# Patient Record
Sex: Male | Born: 1995 | Hispanic: Yes | Marital: Single | Smoking: Never smoker
Health system: Southern US, Community
[De-identification: ages and names within clinical notes are randomized; demographics above are authoritative.]

---
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
## 2016-03-21 IMAGING — DX DG WRIST COMPLETE 3+V*R*
4 series · 4 of 4 positions shown · non-contrast
Comparison: None.

CLINICAL DATA: Right wrist pain, worsening for 2-3 weeks. No known
injury.

EXAM:
RIGHT WRIST - COMPLETE 3+ VIEW

[wrist pa (1 of 2)]
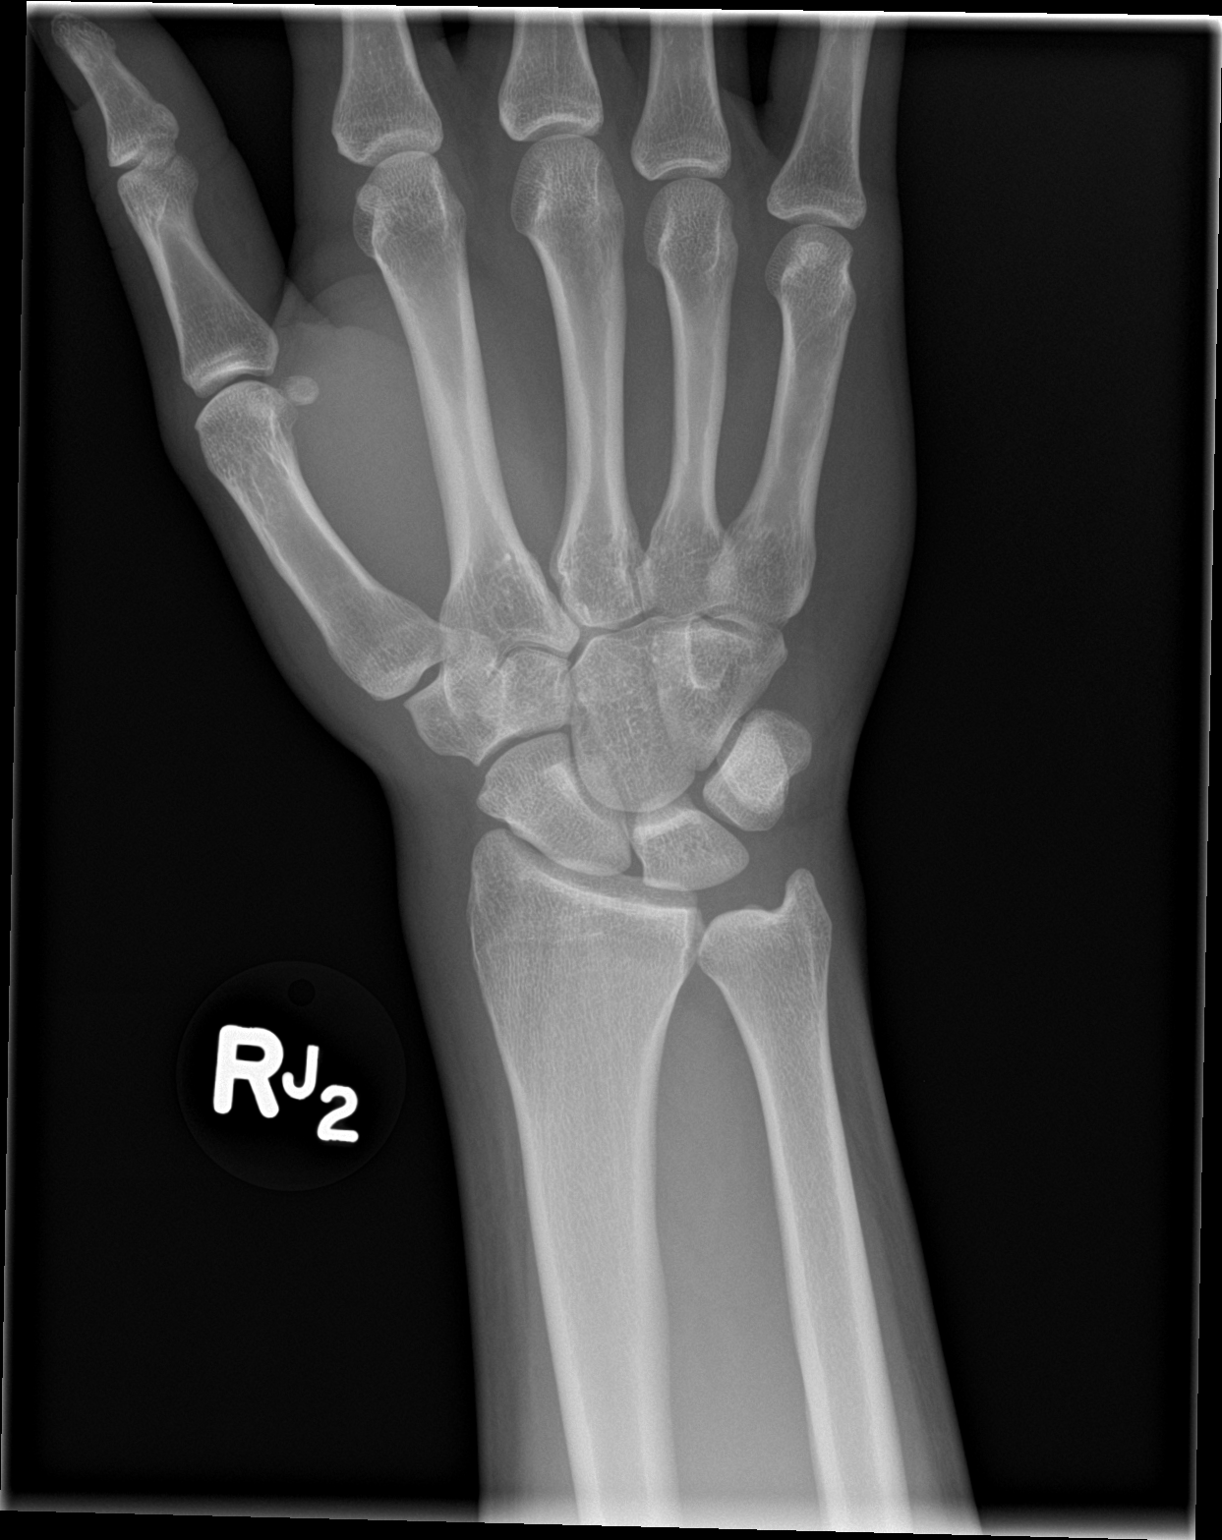

[wrist obl]
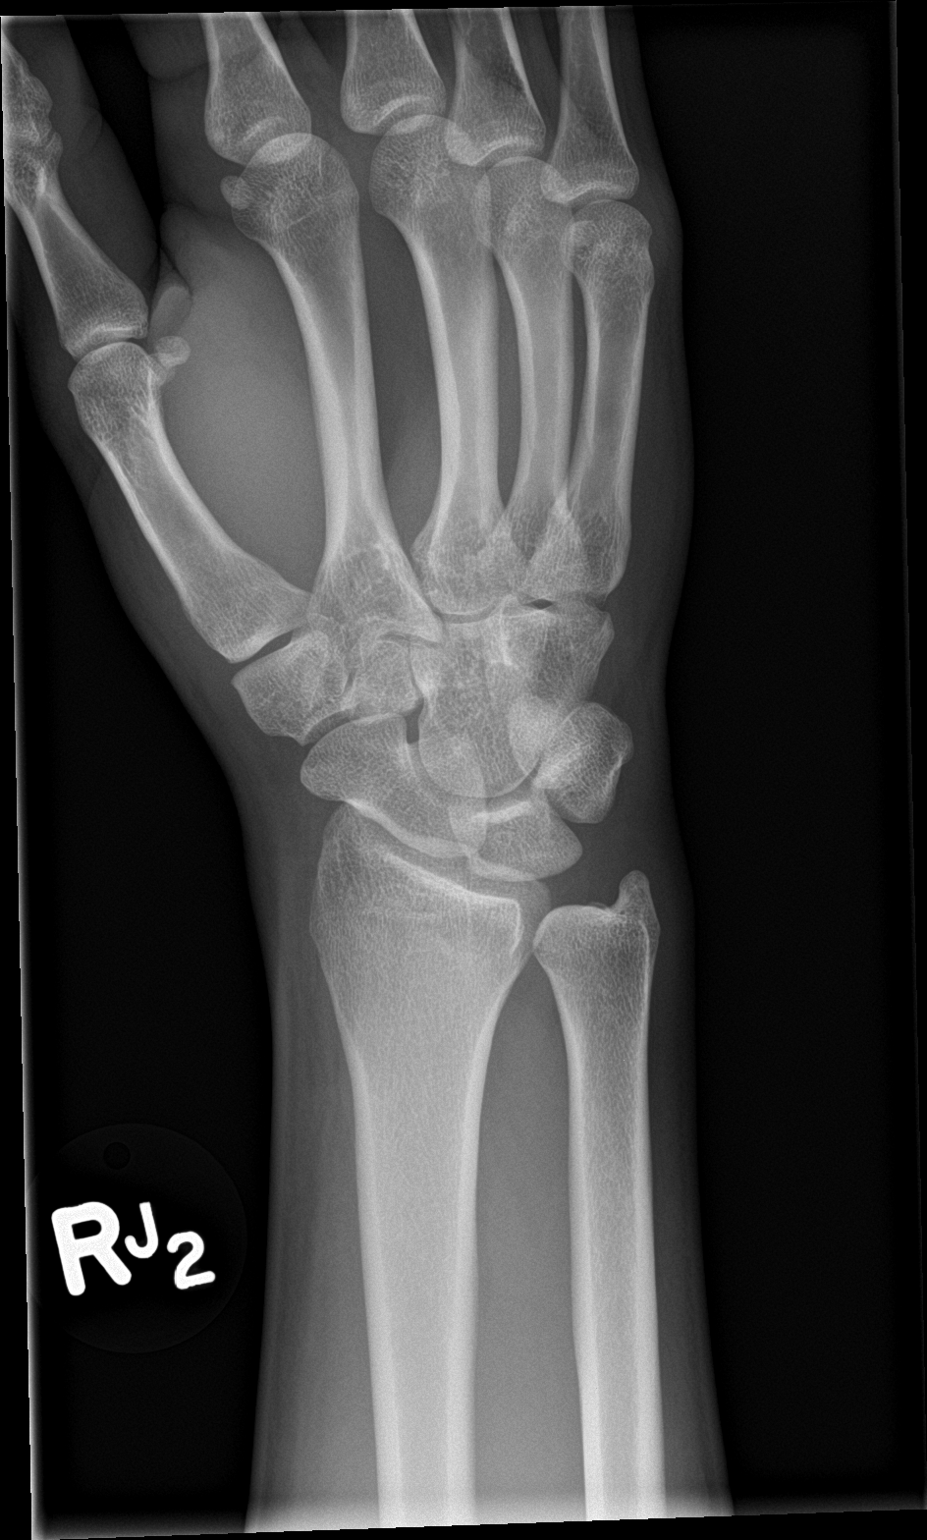

[wrist lat]
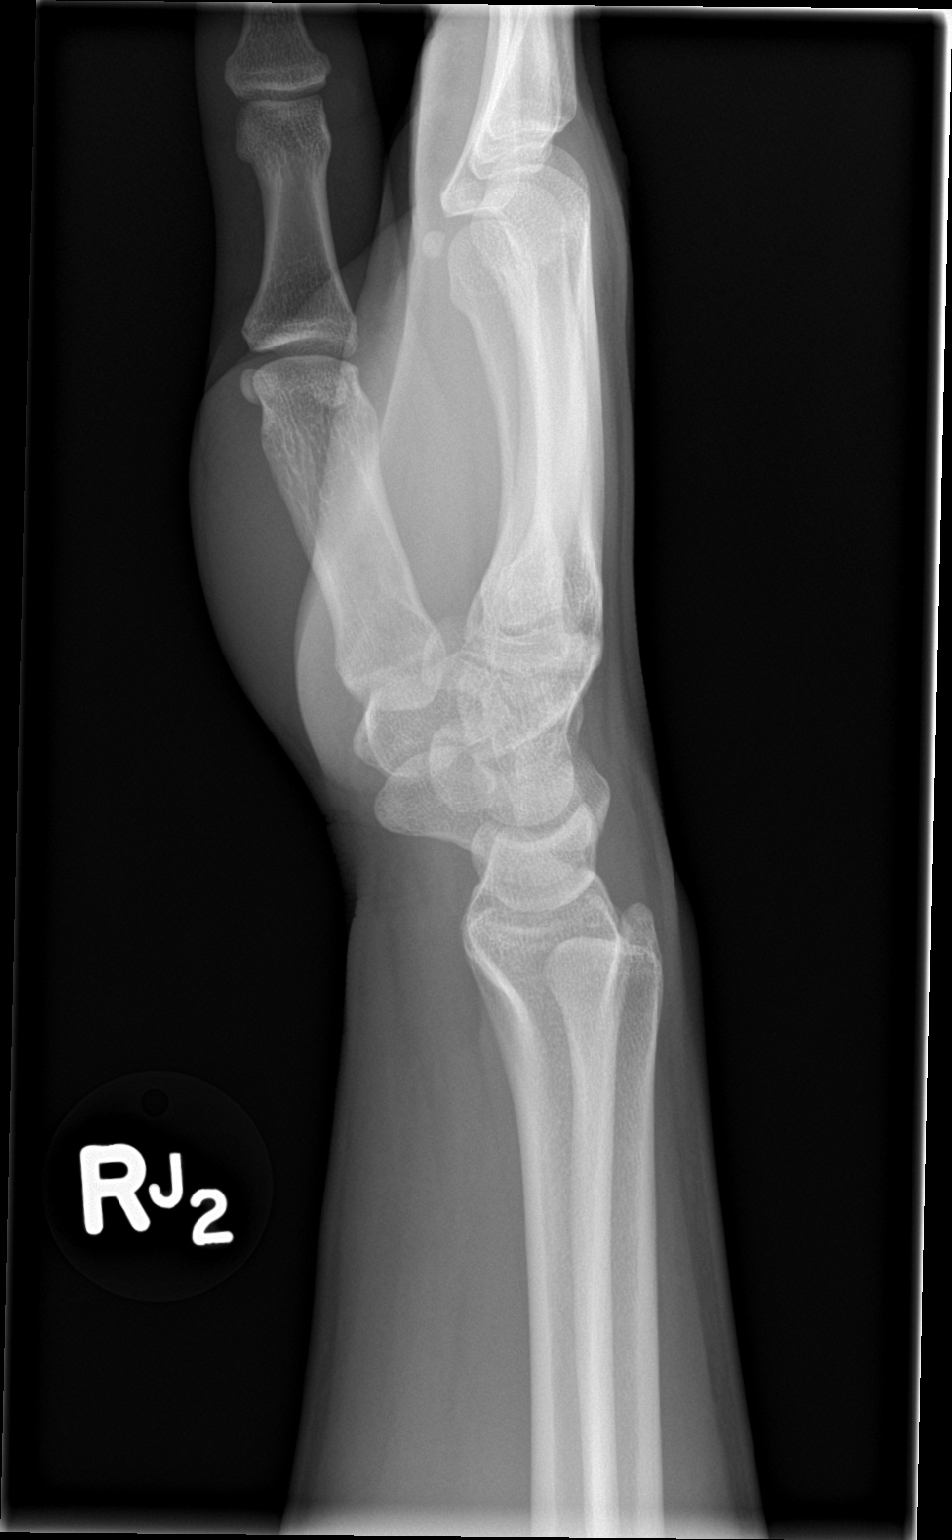

[wrist pa (2 of 2)]
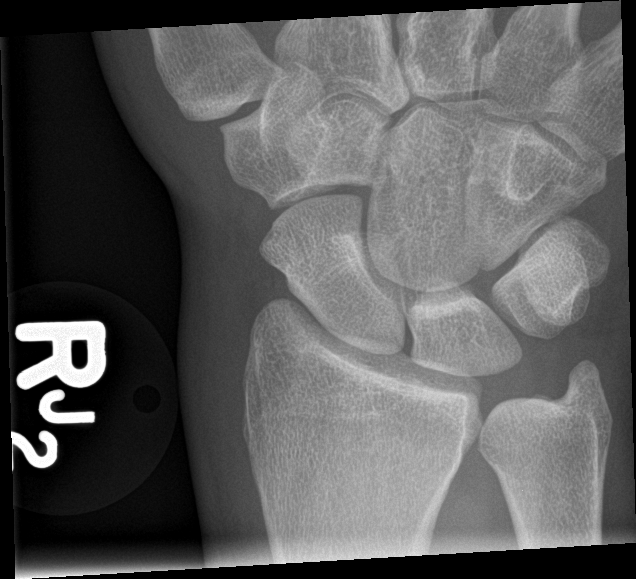

[4 of 4 positions shown; findings below may reference images not displayed]

FINDINGS: There is no evidence of fracture or dislocation. There is no
evidence of arthropathy or other focal bone abnormality. Soft
tissues are unremarkable.
IMPRESSION: Negative.

## 2019-01-13 ENCOUNTER — Other Ambulatory Visit: Payer: Self-pay | Admitting: *Deleted

## 2019-01-13 DIAGNOSIS — Z20822 Contact with and (suspected) exposure to covid-19: Secondary | ICD-10-CM

## 2019-01-14 LAB — NOVEL CORONAVIRUS, NAA: SARS-CoV-2, NAA: DETECTED — AB

## 2020-03-16 ENCOUNTER — Ambulatory Visit: Payer: Self-pay

## 2020-03-17 ENCOUNTER — Ambulatory Visit
Admission: RE | Admit: 2020-03-17 | Discharge: 2020-03-17 | Disposition: A | Discharge status: Home / Self Care | Payer: Self-pay | Source: Ambulatory Visit | Attending: Emergency Medicine | Admitting: Emergency Medicine

## 2020-03-17 ENCOUNTER — Other Ambulatory Visit: Payer: Self-pay

## 2020-03-17 VITALS — BP 132/83 | HR 74 | Temp 99.1°F | Resp 16

## 2020-03-17 DIAGNOSIS — L02413 Cutaneous abscess of right upper limb: Secondary | ICD-10-CM

## 2020-03-17 MED ORDER — DOXYCYCLINE HYCLATE 100 MG PO CAPS: 100.0000 mg | ORAL_CAPSULE | Freq: Two times a day (BID) | ORAL | 0 refills | Status: DC

## 2020-03-17 NOTE — ED Triage Notes (Signed)
Patient has an abcess on his right elbow, pain and tendness to the area.

## 2020-03-17 NOTE — Discharge Instructions (Addendum)
Apply warm compresses 3-4x daily for 10-15 minutes Wash site daily with warm water and mild soap Keep covered to avoid friction Take antibiotic as prescribed and to completion Follow up here or with PCP if symptoms persists Return or go to the ED if you have any new or worsening symptoms increased redness, swelling, pain, nausea, vomiting, fever, chills, etc...  

## 2020-03-17 NOTE — ED Provider Notes (Signed)
  [facility]   [id number] 03/17/20 Arrival Time: 1729   SW:NIOEVOJ  SUBJECTIVE:  Gregory Richardson is a 25 y.o. male who presents to the urgent care with a complaint of abscess to right elbow for the past few days.  Denies any precipitating event.  Localized pain to the right elbow.  Has tried OTC medication without relief.  Denies similar symptoms in the past.  Denies chills, fever, nausea, vomiting, diarrhea.   ROS: As per HPI.  All other pertinent ROS negative.      History reviewed. No pertinent past medical history. History reviewed. No pertinent surgical history. No Known Allergies No current facility-administered medications on file prior to encounter.   Current Outpatient Medications on File Prior to Encounter  Medication Sig Dispense Refill  . ibuprofen (ADVIL,MOTRIN) 600 MG tablet Take 1 tablet (600 mg total) by mouth every 6 (six) hours as needed. 30 tablet 0   Social History   Socioeconomic History  . Marital status: Single    Spouse name: Not on file  . Number of children: Not on file  . Years of education: Not on file  . Highest education level: Not on file  Occupational History  . Not on file  Tobacco Use  . Smoking status: Never Smoker  . Smokeless tobacco: Never Used  Substance and Sexual Activity  . Alcohol use: No  . Drug use: No  . Sexual activity: Not on file  Other Topics Concern  . Not on file  Social History Narrative  . Not on file   Social Determinants of Health   Financial Resource Strain: Not on file  Food Insecurity: Not on file  Transportation Needs: Not on file  Physical Activity: Not on file  Stress: Not on file  Social Connections: Not on file  Intimate Partner Violence: Not on file   History reviewed. No pertinent family history.  OBJECTIVE:  Vitals:   03/17/20 1807  BP: 132/83  Pulse: 74  Resp: 16  Temp: 99.1 F (37.3 C)  SpO2: 98%     General appearance: alert; no distress Chest: CTA, heart sounds  normal Heart: RRR, no rub, gallop or murmur Skin:2 cm induration of his right elbow; tender to touch; with active drainage Psychological: alert and cooperative; normal mood and affect   ASSESSMENT & PLAN:  1. Abscess of right elbow     Meds ordered this encounter  Medications  . doxycycline (VIBRAMYCIN) 100 MG capsule    Sig: Take 1 capsule (100 mg total) by mouth 2 (two) times daily.    Dispense:  20 capsule    Refill:  0   Discharge instructions   Apply warm compresses 3-4x daily for 10-15 minutes Wash site daily with warm water and mild soap Keep covered to avoid friction Take antibiotic as prescribed and to completion Follow up here or with PCP if symptoms persists Return or go to the ED if you have any new or worsening symptoms increased redness, swelling, pain, nausea, vomiting, fever, chills, etc...    Reviewed expectations re: course of current medical issues. Questions answered. Outlined signs and symptoms indicating need for more acute intervention. Patient verbalized understanding. After Visit Summary given.           Durward Parcel, FNP 03/17/20 1840

## 2020-03-18 ENCOUNTER — Ambulatory Visit (HOSPITAL_COMMUNITY): Payer: Self-pay

## 2020-06-22 ENCOUNTER — Other Ambulatory Visit: Payer: Self-pay

## 2020-06-22 ENCOUNTER — Ambulatory Visit
Admission: RE | Admit: 2020-06-22 | Discharge: 2020-06-22 | Disposition: A | Discharge status: Home / Self Care | Payer: Self-pay | Source: Ambulatory Visit | Attending: Family Medicine | Admitting: Family Medicine

## 2020-06-22 VITALS — BP 125/75 | HR 66 | Temp 98.8°F | Resp 14

## 2020-06-22 DIAGNOSIS — B349 Viral infection, unspecified: Secondary | ICD-10-CM

## 2020-06-22 DIAGNOSIS — Z20822 Contact with and (suspected) exposure to covid-19: Secondary | ICD-10-CM

## 2020-06-22 MED ORDER — IPRATROPIUM BROMIDE 0.03 % NA SOLN: 2.0000 | Freq: Two times a day (BID) | NASAL | 0 refills | Status: DC

## 2020-06-22 MED ORDER — LEVOCETIRIZINE DIHYDROCHLORIDE 5 MG PO TABS: 5.0000 mg | ORAL_TABLET | Freq: Every evening | ORAL | 0 refills | Status: DC

## 2020-06-22 NOTE — ED Triage Notes (Signed)
Sore throat and body aches with fever since last night.  Productive cough with clear/yellow sputum.

## 2020-06-22 NOTE — ED Provider Notes (Signed)
[facility]    CSN: [id number] Arrival date & time: 06/22/20  9371      History   Chief Complaint No chief complaint on file.   HPI Gregory Richardson is a 25 y.o. male.   HPI  Patient presents with URI symptoms including cough, sore throat, body aches, fever >100 x 1, nasal congestion x 1 day . Concern for possible strep, recent exposure. No history of recurrent strep. Mild nausea without vomiting. Taken tylenol for pain. Denies worrisome symptoms of shortness of breath, weakness.  History reviewed. No pertinent past medical history.  There are no problems to display for this patient.   History reviewed. No pertinent surgical history.     Home Medications    Prior to Admission medications   Medication Sig Start Date End Date Taking? Authorizing Provider  doxycycline (VIBRAMYCIN) 100 MG capsule Take 1 capsule (100 mg total) by mouth 2 (two) times daily. 03/17/20   Avegno, Zachery Dakins, FNP  ibuprofen (ADVIL,MOTRIN) 600 MG tablet Take 1 tablet (600 mg total) by mouth every 6 (six) hours as needed. 12/15/14   Ivery Quale, PA-C    Family History Family History  Problem Relation Age of Onset  . Diabetes Mother   . Cancer Father     Social History Social History   Tobacco Use  . Smoking status: Never Smoker  . Smokeless tobacco: Never Used  Substance Use Topics  . Alcohol use: No  . Drug use: No     Allergies   Patient has no known allergies.   Review of Systems Review of Systems Pertinent negatives listed in HPI  Physical Exam Triage Vital Signs ED Triage Vitals  Enc Vitals Group     BP 06/22/20 0947 125/75     Pulse Rate 06/22/20 0947 66     Resp 06/22/20 0947 14     Temp 06/22/20 0947 98.8 F (37.1 C)     Temp Source 06/22/20 0947 Oral     SpO2 06/22/20 0947 97 %     Weight --      Height --      Head Circumference --      Peak Flow --      Pain Score 06/22/20 0950 4     Pain Loc --      Pain Edu? --      Excl. in GC? --    No  data found.  Updated Vital Signs BP 125/75 (BP Location: Right Arm)   Pulse 66   Temp 98.8 F (37.1 C) (Oral)   Resp 14   SpO2 97%   Visual Acuity Right Eye Distance:   Left Eye Distance:   Bilateral Distance:    Right Eye Near:   Left Eye Near:    Bilateral Near:     Physical Exam  General Appearance:    Alert, cooperative, no distress  HENT:   Normocephalic, ears normal, nares mucosal edema with congestion, rhinorrhea, oropharynx without exudate or swelling   Eyes:    PERRL, conjunctiva/corneas clear, EOM's intact       Lungs:     Clear to auscultation bilaterally, respirations unlabored  Heart:    Regular rate and rhythm  Neurologic:   Awake, alert, oriented x 3. No apparent focal neurological           defect.      UC Treatments / Results  Labs (all labs ordered are listed, but only abnormal results are displayed) Labs Reviewed  COVID-19, FLU A+B  NAA    EKG   Radiology No results found.  Procedures Procedures (including critical care time)  Medications Ordered in UC Medications - No data to display  Initial Impression / Assessment and Plan / UC Course  I have reviewed the triage vital signs and the nursing notes.  Pertinent labs & imaging results that were available during my care of the patient were reviewed by me and considered in my medical decision making (see chart for details).    Viral illness, COVID/Flu test pending, treating with Atrovent and Levocetirizine for management of symptoms. Continue tylenol and ibuprofen for management of fever Final Clinical Impressions(s) / UC Diagnoses   Final diagnoses:  Exposure to COVID-19 virus  Viral illness   Discharge Instructions   None    ED Prescriptions    Medication Sig Dispense Auth. Provider   ipratropium (ATROVENT) 0.03 % nasal spray Place 2 sprays into both nostrils 2 (two) times daily. 30 mL Bing Neighbors, FNP   levocetirizine (XYZAL) 5 MG tablet Take 1 tablet (5 mg total) by mouth  every evening. 30 tablet Bing Neighbors, FNP     PDMP not reviewed this encounter.   Bing Neighbors, [place] 06/26/20 [phone]

## 2020-06-23 LAB — COVID-19, FLU A+B NAA
Influenza A, NAA: NOT DETECTED
Influenza B, NAA: NOT DETECTED
SARS-CoV-2, NAA: NOT DETECTED

## 2021-02-27 ENCOUNTER — Ambulatory Visit
Admission: EM | Admit: 2021-02-27 | Discharge: 2021-02-27 | Disposition: A | Discharge status: Home / Self Care | Payer: Self-pay | Attending: Urgent Care | Admitting: Urgent Care

## 2021-02-27 ENCOUNTER — Other Ambulatory Visit: Payer: Self-pay

## 2021-02-27 ENCOUNTER — Ambulatory Visit: Payer: Self-pay

## 2021-02-27 DIAGNOSIS — R070 Pain in throat: Secondary | ICD-10-CM | POA: Insufficient documentation

## 2021-02-27 DIAGNOSIS — J029 Acute pharyngitis, unspecified: Secondary | ICD-10-CM | POA: Insufficient documentation

## 2021-02-27 DIAGNOSIS — Z0189 Encounter for other specified special examinations: Secondary | ICD-10-CM | POA: Insufficient documentation

## 2021-02-27 DIAGNOSIS — Z20822 Contact with and (suspected) exposure to covid-19: Secondary | ICD-10-CM | POA: Insufficient documentation

## 2021-02-27 LAB — POCT RAPID STREP A (OFFICE): Rapid Strep A Screen: NEGATIVE

## 2021-02-27 MED ORDER — CHLORASEPTIC 1.4 % MT LIQD: 1.0000 | Freq: Three times a day (TID) | OROMUCOSAL | 0 refills | Status: DC | PRN

## 2021-02-27 MED ORDER — PSEUDOEPHEDRINE HCL 60 MG PO TABS: 60.0000 mg | ORAL_TABLET | Freq: Three times a day (TID) | ORAL | 0 refills | Status: DC | PRN

## 2021-02-27 MED ORDER — NAPROXEN 500 MG PO TABS: 500.0000 mg | ORAL_TABLET | Freq: Two times a day (BID) | ORAL | 0 refills | Status: DC

## 2021-02-27 MED ORDER — CETIRIZINE HCL 10 MG PO TABS: 10.0000 mg | ORAL_TABLET | Freq: Every day | ORAL | 0 refills | Status: DC

## 2021-02-27 NOTE — ED Provider Notes (Signed)
[facility]   MRN: [id number] DOB: 31-Mar-1995  Subjective:   Gregory Richardson is a 26 y.o. male presenting for 1 day history of acute onset throat pain, painful swallowing, swollen tonsil.  No fever, runny or stuffy nose, chest pain, shortness of breath or wheezing.  Wants to be tested for strep, would also like a COVID test as his daughter tested positive.  No current facility-administered medications for this encounter. No current outpatient medications on file.   No Known Allergies  History reviewed. No pertinent past medical history.   History reviewed. No pertinent surgical history.  Family History  Problem Relation Age of Onset   Diabetes Mother    Cancer Father     Social History   Tobacco Use   Smoking status: Never   Smokeless tobacco: Never  Substance Use Topics   Alcohol use: No   Drug use: Yes    Frequency: 1.0 times per week    ROS   Objective:   Vitals: BP 136/83 (BP Location: Right Arm)    Pulse 69    Temp 99.2 F (37.3 C) (Oral)    Resp 16    SpO2 98%   Physical Exam Constitutional:      General: He is not in acute distress.    Appearance: Normal appearance. He is well-developed. He is not ill-appearing, toxic-appearing or diaphoretic.  HENT:     Head: Normocephalic and atraumatic.     Right Ear: External ear normal.     Left Ear: External ear normal.     Nose: Nose normal.     Mouth/Throat:     Mouth: Mucous membranes are moist.     Pharynx: No pharyngeal swelling, oropharyngeal exudate, posterior oropharyngeal erythema or uvula swelling.     Tonsils: No tonsillar exudate or tonsillar abscesses. 0 on the right. 0 on the left.     Comments: Thick streaks of postnasal drainage overlying pharynx. Eyes:     General: No scleral icterus.    Extraocular Movements: Extraocular movements intact.     Pupils: Pupils are equal, round, and reactive to light.  Cardiovascular:     Rate and Rhythm: Normal rate and regular rhythm.     Heart  sounds: Normal heart sounds. No murmur heard.   No friction rub. No gallop.  Pulmonary:     Effort: Pulmonary effort is normal. No respiratory distress.     Breath sounds: Normal breath sounds. No stridor. No wheezing, rhonchi or rales.  Neurological:     Mental Status: He is alert and oriented to person, place, and time.  Psychiatric:        Mood and Affect: Mood normal.        Behavior: Behavior normal.        Thought Content: Thought content normal.    Results for orders placed or performed during the hospital encounter of 02/27/21 (from the past 24 hour(s))  POCT rapid strep A     Status: None   Collection Time: 02/27/21  6:25 PM  Result Value Ref Range   Rapid Strep A Screen Negative Negative    Assessment and Plan :   PDMP not reviewed this encounter.  1. Viral pharyngitis   2. Patient requested diagnostic testing   3. Throat pain   4. Close exposure to COVID-19 virus    Recommended managing with supportive care for viral pharyngitis, viral URI.  Strep culture and COVID-19 testing pending. Counseled patient on potential for adverse effects with medications prescribed/recommended today, ER  and return-to-clinic precautions discussed, patient verbalized understanding.    Wallis Bamberg, [place] 02/27/21 1849

## 2021-02-27 NOTE — ED Triage Notes (Signed)
Pt reports sore throat and swollen tonsils sx 1 day.   Ppt requested COVID test, as daughter has COVID.

## 2021-03-01 ENCOUNTER — Telehealth (HOSPITAL_COMMUNITY): Payer: Self-pay

## 2021-03-01 LAB — CULTURE, GROUP A STREP (THRC)

## 2021-03-01 LAB — COVID-19, FLU A+B NAA
Influenza A, NAA: NOT DETECTED
Influenza B, NAA: NOT DETECTED
SARS-CoV-2, NAA: NOT DETECTED

## 2021-03-01 MED ORDER — PENICILLIN V POTASSIUM 500 MG PO TABS: 500.0000 mg | ORAL_TABLET | Freq: Two times a day (BID) | ORAL | 0 refills | Status: AC

## 2021-08-10 ENCOUNTER — Ambulatory Visit
Admission: EM | Admit: 2021-08-10 | Discharge: 2021-08-10 | Disposition: A | Discharge status: Home / Self Care | Payer: Self-pay

## 2021-08-10 DIAGNOSIS — R21 Rash and other nonspecific skin eruption: Secondary | ICD-10-CM

## 2021-08-10 MED ORDER — TRIAMCINOLONE ACETONIDE 0.1 % EX CREA: 1.0000 | TOPICAL_CREAM | Freq: Two times a day (BID) | CUTANEOUS | 0 refills | Status: DC

## 2021-08-10 MED ORDER — PREDNISONE 50 MG PO TABS: ORAL_TABLET | ORAL | 0 refills | Status: DC

## 2021-08-10 MED ORDER — DEXAMETHASONE SODIUM PHOSPHATE 10 MG/ML IJ SOLN
10.0000 mg | INTRAMUSCULAR | Status: AC
  Administered 2021-08-10: 10 mg via INTRAMUSCULAR

## 2021-08-10 NOTE — Discharge Instructions (Addendum)
Take medication as prescribed. Try to avoid scratching, rubbing, or irritating the areas while symptoms persist. Avoid hot baths or showers while symptoms persist.  Recommend lukewarm baths and showers. Purchase over-the-counter Aveeno colloidal oatmeal bath to help drying the rash and itching. May take over-the-counter cetirizine or Zyrtec each morning to help with itching, and may take Benadryl at bedtime. Follow-up if symptoms do not improve.  

## 2021-08-10 NOTE — ED Triage Notes (Signed)
Pt reports rash in arms, neck and abdomen x 3 days. Calamine lotion gives some relief.

## 2021-08-10 NOTE — ED Provider Notes (Signed)
[facility]    CSN: [id number] Arrival date & time: 08/10/21  1530      History   Chief Complaint Chief Complaint  Patient presents with   Poison Ivy    HPI Gregory Richardson is a 26 y.o. male.   The history is provided by the patient.   Patient presents for rash has been present for the past 3 days.  Rash is located on the patient's arms, under his neck, and on his left abdomen.  Patient states that rash is itchy.  He is unsure if he has come into contact with anything such as poison ivy, poison oak, or poison sumac.  He denies fever, chills, abdominal pain, any new lotions, detergents, medications or foods.  Patient has been using calamine lotion and another over-the-counter topical for his symptoms.  History reviewed. No pertinent past medical history.  There are no problems to display for this patient.   History reviewed. No pertinent surgical history.     Home Medications    Prior to Admission medications   Medication Sig Start Date End Date Taking? Authorizing Provider  calamine lotion Apply 1 Application topically as needed for itching.   Yes [provider]  predniSONE (DELTASONE) 50 MG tablet Take 1 tablet daily with breakfast for 5 days. 08/10/21  Yes Tawnee Clegg-Warren, Sadie Haber, NP  triamcinolone cream (KENALOG) 0.1 % Apply 1 Application topically 2 (two) times daily. 08/10/21  Yes Bernard Slayden-Warren, Sadie Haber, NP  cetirizine (ZYRTEC ALLERGY) 10 MG tablet Take 1 tablet (10 mg total) by mouth daily. 02/27/21   Wallis Bamberg, PA-C  naproxen (NAPROSYN) 500 MG tablet Take 1 tablet (500 mg total) by mouth 2 (two) times daily with a meal. 02/27/21   Wallis Bamberg, PA-C  phenol (CHLORASEPTIC) 1.4 % LIQD Use as directed 1 spray in the mouth or throat 3 (three) times daily as needed for throat irritation / pain. 02/27/21   Wallis Bamberg, PA-C  pseudoephedrine (SUDAFED) 60 MG tablet Take 1 tablet (60 mg total) by mouth every 8 (eight) hours as needed for congestion. 02/27/21    Wallis Bamberg, PA-C    Family History Family History  Problem Relation Age of Onset   Diabetes Mother    Cancer Father     Social History Social History   Tobacco Use   Smoking status: Never   Smokeless tobacco: Never  Substance Use Topics   Alcohol use: No   Drug use: Yes    Frequency: 1.0 times per week     Allergies   Patient has no known allergies.   Review of Systems Review of Systems Per HPI  Physical Exam Triage Vital Signs ED Triage Vitals  Enc Vitals Group     BP 08/10/21 1541 (!) 142/82     Pulse Rate 08/10/21 1541 75     Resp 08/10/21 1541 16     Temp 08/10/21 1541 99.2 F (37.3 C)     Temp Source 08/10/21 1541 Oral     SpO2 08/10/21 1541 96 %     Weight --      Height --      Head Circumference --      Peak Flow --      Pain Score 08/10/21 1542 0     Pain Loc --      Pain Edu? --      Excl. in GC? --    No data found.  Updated Vital Signs BP (!) 142/82 (BP Location: Right Arm)  Pulse 75   Temp 99.2 F (37.3 C) (Oral)   Resp 16   SpO2 96%   Visual Acuity Right Eye Distance:   Left Eye Distance:   Bilateral Distance:    Right Eye Near:   Left Eye Near:    Bilateral Near:     Physical Exam Vitals and nursing note reviewed.  Constitutional:      Appearance: Normal appearance.  Eyes:     Extraocular Movements: Extraocular movements intact.     Pupils: Pupils are equal, round, and reactive to light.  Pulmonary:     Effort: Pulmonary effort is normal.  Musculoskeletal:     Cervical back: Normal range of motion.  Skin:    General: Skin is warm and dry.     Findings: Rash present. Rash is macular, papular and urticarial.     Comments: Rash located to bilateral forearms, anterior neck, middle neck, and left abdomen.  Neurological:     General: No focal deficit present.     Mental Status: He is alert and oriented to person, place, and time.  Psychiatric:        Mood and Affect: Mood normal.        Behavior: Behavior normal.       UC Treatments / Results  Labs (all labs ordered are listed, but only abnormal results are displayed) Labs Reviewed - No data to display  EKG   Radiology No results found.  Procedures Procedures (including critical care time)  Medications Ordered in UC Medications  dexamethasone (DECADRON) injection 10 mg (10 mg Intramuscular Given 08/10/21 1552)    Initial Impression / Assessment and Plan / UC Course  I have reviewed the triage vital signs and the nursing notes.  Pertinent labs & imaging results that were available during my care of the patient were reviewed by me and considered in my medical decision making (see chart for details).  Patient presents with rash has been present for the past 3 days.  On exam, rash is urticarial, maculopapular, and consistent with contact dermatitis due to plants.  Decadron 10 mg injection given in the office today.  Supportive care recommendations were provided to the patient.  We will treat patient with prednisone and triamcinolone cream.  Patient advised to follow-up if symptoms do not improve. Final Clinical Impressions(s) / UC Diagnoses   Final diagnoses:  Rash and nonspecific skin eruption     Discharge Instructions      Take medication as prescribed. Try to avoid scratching, rubbing, or irritating the areas while symptoms persist. Avoid hot baths or showers while symptoms persist.  Recommend lukewarm baths and showers. Purchase over-the-counter Aveeno colloidal oatmeal bath to help drying the rash and itching. May take over-the-counter cetirizine or Zyrtec each morning to help with itching, and may take Benadryl at bedtime. Follow-up if symptoms do not improve.     ED Prescriptions     Medication Sig Dispense Auth. Provider   predniSONE (DELTASONE) 50 MG tablet Take 1 tablet daily with breakfast for 5 days. 5 tablet Marche Hottenstein-Warren, Sadie Haber, NP   triamcinolone cream (KENALOG) 0.1 % Apply 1 Application topically 2 (two)  times daily. 45 g Jru Pense-Warren, Sadie Haber, NP      PDMP not reviewed this encounter.   Abran Cantor, NP 08/10/21 1601

## 2022-02-22 ENCOUNTER — Encounter: Payer: Self-pay | Admitting: Emergency Medicine

## 2022-02-22 ENCOUNTER — Ambulatory Visit
Admission: EM | Admit: 2022-02-22 | Discharge: 2022-02-22 | Disposition: A | Discharge status: Home / Self Care | Payer: Self-pay | Attending: Nurse Practitioner | Admitting: Nurse Practitioner

## 2022-02-22 DIAGNOSIS — R6889 Other general symptoms and signs: Secondary | ICD-10-CM

## 2022-02-22 DIAGNOSIS — B349 Viral infection, unspecified: Secondary | ICD-10-CM

## 2022-02-22 MED ORDER — IBUPROFEN 800 MG PO TABS
800.0000 mg | ORAL_TABLET | Freq: Once | ORAL | Status: AC
  Administered 2022-02-22: 800 mg via ORAL

## 2022-02-22 MED ORDER — PSEUDOEPH-BROMPHEN-DM 30-2-10 MG/5ML PO SYRP: 5.0000 mL | ORAL_SOLUTION | Freq: Four times a day (QID) | ORAL | 0 refills | Status: DC | PRN

## 2022-02-22 MED ORDER — ONDANSETRON HCL 4 MG PO TABS: 4.0000 mg | ORAL_TABLET | Freq: Three times a day (TID) | ORAL | 0 refills | Status: DC | PRN

## 2022-02-22 MED ORDER — OSELTAMIVIR PHOSPHATE 75 MG PO CAPS: 75.0000 mg | ORAL_CAPSULE | Freq: Two times a day (BID) | ORAL | 0 refills | Status: DC

## 2022-02-22 NOTE — Discharge Instructions (Signed)
Take medication as prescribed. Increase fluids and allow for plenty of rest.  Try to drink at least 8-10 8 ounce glasses of water while symptoms persist. Warm salt water gargles 3-4 times daily for throat pain or discomfort. Recommend sleeping elevated on pillows and using a humidifier in your bedroom at nighttime during sleep while cough symptoms persist. Recommend a brat diet until nausea improves.  This includes bananas, rice, applesauce, and toast. Recommend remaining isolated until you have been fever free for at least 24 hours with no medication. Please be advised that a viral illness can last anywhere from 7 to 14 days.  If symptoms suddenly worsen, or extend beyond that time, please follow-up with your primary care physician for further evaluation. Follow-up as needed.

## 2022-02-22 NOTE — ED Provider Notes (Signed)
[facility]    CSN: [id number] Arrival date & time: 02/22/22  1305      History   Chief Complaint No chief complaint on file.   HPI Gregory Richardson is a 26 y.o. male.   The history is provided by the patient.   The patient presents for complaints of fever, body aches, cough,fatigue, and nausea that been present over the past 24 hours.  He denies ear pain, wheezing, shortness of breath, difficulty breathing, abdominal pain, or diarrhea.  Patient reports he has been taking Tylenol for his symptoms, with his last dose around 8 AM this morning.  He reports that a close family member has had influenza.  History reviewed. No pertinent past medical history.  There are no problems to display for this patient.   History reviewed. No pertinent surgical history.     Home Medications    Prior to Admission medications   Medication Sig Start Date End Date Taking? Authorizing Provider  brompheniramine-pseudoephedrine-DM 30-2-10 MG/5ML syrup Take 5 mLs by mouth 4 (four) times daily as needed. 02/22/22  Yes Azekiel Cremer-Warren, Sadie Haber, NP  ondansetron (ZOFRAN) 4 MG tablet Take 1 tablet (4 mg total) by mouth every 8 (eight) hours as needed for nausea or vomiting. 02/22/22  Yes Scarleth Brame-Warren, Sadie Haber, NP  oseltamivir (TAMIFLU) 75 MG capsule Take 1 capsule (75 mg total) by mouth every 12 (twelve) hours. 02/22/22  Yes Naly Schwanz-Warren, Sadie Haber, NP  calamine lotion Apply 1 Application topically as needed for itching.    [provider]  cetirizine (ZYRTEC ALLERGY) 10 MG tablet Take 1 tablet (10 mg total) by mouth daily. 02/27/21   Wallis Bamberg, PA-C  naproxen (NAPROSYN) 500 MG tablet Take 1 tablet (500 mg total) by mouth 2 (two) times daily with a meal. 02/27/21   Wallis Bamberg, PA-C  phenol (CHLORASEPTIC) 1.4 % LIQD Use as directed 1 spray in the mouth or throat 3 (three) times daily as needed for throat irritation / pain. 02/27/21   Wallis Bamberg, PA-C  predniSONE (DELTASONE) 50 MG tablet  Take 1 tablet daily with breakfast for 5 days. 08/10/21   Marquin Patino-Warren, Sadie Haber, NP  pseudoephedrine (SUDAFED) 60 MG tablet Take 1 tablet (60 mg total) by mouth every 8 (eight) hours as needed for congestion. 02/27/21   Wallis Bamberg, PA-C  triamcinolone cream (KENALOG) 0.1 % Apply 1 Application topically 2 (two) times daily. 08/10/21   Casanova Schurman-Warren, Sadie Haber, NP    Family History Family History  Problem Relation Age of Onset   Diabetes Mother    Cancer Father     Social History Social History   Tobacco Use   Smoking status: Never   Smokeless tobacco: Never  Vaping Use   Vaping Use: Never used  Substance Use Topics   Alcohol use: No   Drug use: Yes    Frequency: 1.0 times per week     Allergies   Patient has no known allergies.   Review of Systems Review of Systems Per HPI  Physical Exam Triage Vital Signs ED Triage Vitals  Enc Vitals Group     BP 02/22/22 1456 132/67     Pulse Rate 02/22/22 1456 93     Resp 02/22/22 1456 18     Temp 02/22/22 1456 (!) 102.5 F (39.2 C)     Temp Source 02/22/22 1456 Oral     SpO2 02/22/22 1456 97 %     Weight --      Height --  Head Circumference --      Peak Flow --      Pain Score 02/22/22 1457 4     Pain Loc --      Pain Edu? --      Excl. in GC? --    No data found.  Updated Vital Signs BP 132/67 (BP Location: Right Arm)   Pulse 93   Temp (!) 102.5 F (39.2 C) (Oral)   Resp 18   SpO2 97%   Visual Acuity Right Eye Distance:   Left Eye Distance:   Bilateral Distance:    Right Eye Near:   Left Eye Near:    Bilateral Near:     Physical Exam Vitals and nursing note reviewed.  Constitutional:      General: He is not in acute distress.    Appearance: Normal appearance.  HENT:     Head: Normocephalic.     Right Ear: Tympanic membrane, ear canal and external ear normal.     Left Ear: Tympanic membrane, ear canal and external ear normal.     Nose: Congestion present. No rhinorrhea.     Right Turbinates:  Enlarged and swollen.     Left Turbinates: Enlarged and swollen.     Right Sinus: No maxillary sinus tenderness or frontal sinus tenderness.     Left Sinus: No maxillary sinus tenderness or frontal sinus tenderness.     Mouth/Throat:     Lips: Pink.     Mouth: Mucous membranes are moist.     Pharynx: Uvula midline. Posterior oropharyngeal erythema present. No pharyngeal swelling.  Eyes:     Extraocular Movements: Extraocular movements intact.     Conjunctiva/sclera: Conjunctivae normal.     Pupils: Pupils are equal, round, and reactive to light.  Cardiovascular:     Rate and Rhythm: Normal rate and regular rhythm.     Pulses: Normal pulses.     Heart sounds: Normal heart sounds.  Pulmonary:     Effort: Pulmonary effort is normal. No respiratory distress.     Breath sounds: Normal breath sounds. No stridor. No wheezing, rhonchi or rales.  Abdominal:     General: Bowel sounds are normal.     Palpations: Abdomen is soft.     Tenderness: There is no abdominal tenderness.  Musculoskeletal:     Cervical back: Normal range of motion.  Lymphadenopathy:     Cervical: No cervical adenopathy.  Skin:    General: Skin is warm and dry.  Neurological:     General: No focal deficit present.     Mental Status: He is alert and oriented to person, place, and time.  Psychiatric:        Mood and Affect: Mood normal.        Behavior: Behavior normal.      UC Treatments / Results  Labs (all labs ordered are listed, but only abnormal results are displayed) Labs Reviewed - No data to display  EKG   Radiology No results found.  Procedures Procedures (including critical care time)  Medications Ordered in UC Medications  ibuprofen (ADVIL) tablet 800 mg (800 mg Oral Given 02/22/22 1500)    Initial Impression / Assessment and Plan / UC Course  I have reviewed the triage vital signs and the nursing notes.  Pertinent labs & imaging results that were available during my care of the  patient were reviewed by me and considered in my medical decision making (see chart for details).  The patient is well-appearing, he is in  no acute distress, vital signs are stable.  Symptoms are consistent with a viral illness, most likely influenza.  Will treat patient with Tamiflu 75 mg, and for his cough, Bromfed-DM.  For his nausea, patient was prescribed ondansetron 4 mg.  Supportive care recommendations were provided to the patient to include continuing Tylenol, implementing a brat diet, and increasing fluids and allowing for plenty of rest.  Discussed viral etiology with the patient and when follow-up may be indicated.  Patient verbalizes understanding.  All questions were answered.  Patient stable for discharge.  Note was provided for work.   Final Clinical Impressions(s) / UC Diagnoses   Final diagnoses:  Flu-like symptoms  Viral illness     Discharge Instructions      Take medication as prescribed. Increase fluids and allow for plenty of rest.  Try to drink at least 8-10 8 ounce glasses of water while symptoms persist. Warm salt water gargles 3-4 times daily for throat pain or discomfort. Recommend sleeping elevated on pillows and using a humidifier in your bedroom at nighttime during sleep while cough symptoms persist. Recommend a brat diet until nausea improves.  This includes bananas, rice, applesauce, and toast. Recommend remaining isolated until you have been fever free for at least 24 hours with no medication. Please be advised that a viral illness can last anywhere from 7 to 14 days.  If symptoms suddenly worsen, or extend beyond that time, please follow-up with your primary care physician for further evaluation. Follow-up as needed.     ED Prescriptions     Medication Sig Dispense Auth. Provider   oseltamivir (TAMIFLU) 75 MG capsule Take 1 capsule (75 mg total) by mouth every 12 (twelve) hours. 10 capsule Glorimar Stroope-Warren, Sadie Haber, NP   ondansetron (ZOFRAN) 4 MG  tablet Take 1 tablet (4 mg total) by mouth every 8 (eight) hours as needed for nausea or vomiting. 20 tablet Jerie Basford-Warren, Sadie Haber, NP   brompheniramine-pseudoephedrine-DM 30-2-10 MG/5ML syrup Take 5 mLs by mouth 4 (four) times daily as needed. 140 mL Jonny Longino-Warren, Sadie Haber, NP      PDMP not reviewed this encounter.   Abran Cantor, NP 02/22/22 1530

## 2022-02-22 NOTE — ED Triage Notes (Signed)
Fever since yesterday.  Itchy throat, nausea, productive cough, body aches since yesterday.

## 2023-03-20 ENCOUNTER — Ambulatory Visit: Payer: Self-pay | Admitting: Nurse Practitioner

## 2023-04-19 ENCOUNTER — Ambulatory Visit: Payer: Self-pay | Admitting: Nurse Practitioner

## 2023-05-22 ENCOUNTER — Ambulatory Visit: Payer: Self-pay | Admitting: Family Medicine

## 2023-05-22 ENCOUNTER — Encounter: Payer: Self-pay | Admitting: Family Medicine

## 2023-05-22 VITALS — BP 119/71 | HR 67 | Ht 69.0 in | Wt 206.2 lb

## 2023-05-22 DIAGNOSIS — Z13220 Encounter for screening for lipoid disorders: Secondary | ICD-10-CM | POA: Diagnosis not present

## 2023-05-22 DIAGNOSIS — Z114 Encounter for screening for human immunodeficiency virus [HIV]: Secondary | ICD-10-CM

## 2023-05-22 DIAGNOSIS — Z1159 Encounter for screening for other viral diseases: Secondary | ICD-10-CM

## 2023-05-22 DIAGNOSIS — E66811 Obesity, class 1: Secondary | ICD-10-CM | POA: Diagnosis not present

## 2023-05-22 DIAGNOSIS — Z0000 Encounter for general adult medical examination without abnormal findings: Secondary | ICD-10-CM

## 2023-05-22 DIAGNOSIS — J301 Allergic rhinitis due to pollen: Secondary | ICD-10-CM

## 2023-05-22 DIAGNOSIS — Z833 Family history of diabetes mellitus: Secondary | ICD-10-CM | POA: Diagnosis not present

## 2023-05-22 DIAGNOSIS — Z130 Encounter for screening for diseases of the blood and blood-forming organs and certain disorders involving the immune mechanism: Secondary | ICD-10-CM | POA: Diagnosis not present

## 2023-05-22 DIAGNOSIS — Z1329 Encounter for screening for other suspected endocrine disorder: Secondary | ICD-10-CM

## 2023-05-22 DIAGNOSIS — Z7689 Persons encountering health services in other specified circumstances: Secondary | ICD-10-CM

## 2023-05-22 DIAGNOSIS — Z13228 Encounter for screening for other metabolic disorders: Secondary | ICD-10-CM

## 2023-05-22 NOTE — Assessment & Plan Note (Signed)
 Taking OTC allergy medication; does not recall name. No acute concerns.  Continue to monitor.

## 2023-05-22 NOTE — Progress Notes (Signed)
 New patient visit   Patient: Gregory Richardson   DOB: [birth year]   28 y.o. Male  MRN: [id number] Visit Date: 05/22/2023  Today's healthcare provider: Sherlyn Hay, DO   Chief Complaint  Patient presents with   Establish Care    Family history of diabetes wants a full blood panel work up done     Subjective    Gregory Richardson is a 28 y.o. male who presents today as a new patient to establish care.  HPI HPI     Establish Care    Additional comments: Family history of diabetes wants a full blood panel work up done        Last edited by [name] on 05/22/2023  1:56 PM.      Gregory Richardson is a 28 year old male who presents for a routine physical exam and blood work.  He has not seen a healthcare provider in approximately ten years. No issues with fatigue, changes in urination, appetite, or thirst. No chest pain, shortness of breath, nausea, vomiting, headaches, dizziness, lightheadedness, fever, chills, ear pain, sore throat, or numbness and tingling.  He experiences congestion due to seasonal allergies, which he manages with over-the-counter allergy medication from [facility], though he is unsure if it is equivalent to Claritin, Zyrtec, or Xyzal.  He denies any swelling in his ankles but notes that his feet sometimes hurt from wearing boots at work. He does not use additional inserts in his boots.  He does not smoke or drink alcohol and reports occasional marijuana use. He is not currently taking any prescribed medications, only over-the-counter allergy medication.    History reviewed. No pertinent past medical history. History reviewed. No pertinent surgical history. Family Status  Relation Name Status   Mother  Alive   Father  Deceased  No partnership data on file   Family History  Problem Relation Age of Onset   Diabetes Mother    Cancer Father    Social History   Socioeconomic History   Marital status: Single    Spouse name: Not on file   Number of children: Not on file    Years of education: Not on file   Highest education level: Not on file  Occupational History   Not on file  Tobacco Use   Smoking status: Never   Smokeless tobacco: Never  Vaping Use   Vaping status: Never Used  Substance and Sexual Activity   Alcohol use: No   Drug use: Yes    Frequency: 1.0 times per week   Sexual activity: Yes  Other Topics Concern   Not on file  Social History Narrative   Not on file   Social Drivers of Health   Financial Resource Strain: Low Risk  (05/22/2023)   Overall Financial Resource Strain (CARDIA)    Difficulty of Paying Living Expenses: Not hard at all  Food Insecurity: No Food Insecurity (05/22/2023)   Hunger Vital Sign    Worried About Running Out of Food in the Last Year: Never true    Ran Out of Food in the Last Year: Never true  Transportation Needs: No Transportation Needs (05/22/2023)   PRAPARE - Administrator, Civil Service (Medical): No    Lack of Transportation (Non-Medical): No  Physical Activity: Not on file  Stress: No Stress Concern Present (05/22/2023)   [employer] of Occupational Health - Occupational Stress Questionnaire    Feeling of Stress : Only a little  Social Connections: Not on file  Outpatient Medications Prior to Visit  Medication Sig   [DISCONTINUED] brompheniramine-pseudoephedrine-DM 30-2-10 MG/5ML syrup Take 5 mLs by mouth 4 (four) times daily as needed. (Patient not taking: Reported on 05/22/2023)   [DISCONTINUED] calamine lotion Apply 1 Application topically as needed for itching. (Patient not taking: Reported on 05/22/2023)   [DISCONTINUED] cetirizine (ZYRTEC ALLERGY) 10 MG tablet Take 1 tablet (10 mg total) by mouth daily. (Patient not taking: Reported on 05/22/2023)   [DISCONTINUED] naproxen (NAPROSYN) 500 MG tablet Take 1 tablet (500 mg total) by mouth 2 (two) times daily with a meal. (Patient not taking: Reported on 05/22/2023)   [DISCONTINUED] ondansetron (ZOFRAN) 4 MG tablet Take 1 tablet (4  mg total) by mouth every 8 (eight) hours as needed for nausea or vomiting. (Patient not taking: Reported on 05/22/2023)   [DISCONTINUED] oseltamivir (TAMIFLU) 75 MG capsule Take 1 capsule (75 mg total) by mouth every 12 (twelve) hours. (Patient not taking: Reported on 05/22/2023)   [DISCONTINUED] phenol (CHLORASEPTIC) 1.4 % LIQD Use as directed 1 spray in the mouth or throat 3 (three) times daily as needed for throat irritation / pain. (Patient not taking: Reported on 05/22/2023)   [DISCONTINUED] predniSONE (DELTASONE) 50 MG tablet Take 1 tablet daily with breakfast for 5 days. (Patient not taking: Reported on 05/22/2023)   [DISCONTINUED] pseudoephedrine (SUDAFED) 60 MG tablet Take 1 tablet (60 mg total) by mouth every 8 (eight) hours as needed for congestion. (Patient not taking: Reported on 05/22/2023)   [DISCONTINUED] triamcinolone cream (KENALOG) 0.1 % Apply 1 Application topically 2 (two) times daily. (Patient not taking: Reported on 05/22/2023)   No facility-administered medications prior to visit.   No Known Allergies   There is no immunization history on file for this patient.  Health Maintenance  Topic Date Due   DTaP/Tdap/Td (1 - Tdap) Never done   COVID-19 Vaccine (1 - 2024-25 season) 11/27/2023 (Originally 10/28/2022)   INFLUENZA VACCINE  09/27/2023   Hepatitis C Screening  Completed   HIV Screening  Completed   HPV VACCINES  Aged Out    Patient Care Team: Tierria Watson, Monico Blitz, DO as PCP - General (Family Medicine)  Review of Systems  Constitutional:  Negative for appetite change, chills, fatigue and fever.  HENT:  Positive for congestion (has seasonal allergies). Negative for ear pain, hearing loss, nosebleeds and trouble swallowing.   Eyes:  Negative for pain and visual disturbance.  Respiratory:  Negative for cough, chest tightness and shortness of breath.   Cardiovascular:  Negative for chest pain, palpitations and leg swelling.  Gastrointestinal:  Negative for abdominal pain,  blood in stool, constipation, diarrhea, nausea and vomiting.  Endocrine: Negative for polydipsia, polyphagia and polyuria.  Genitourinary:  Negative for dysuria and flank pain.  Musculoskeletal:  Negative for arthralgias, back pain, joint swelling, myalgias and neck stiffness.  Skin:  Negative for color change, rash and wound.  Neurological:  Negative for dizziness, tremors, seizures, speech difficulty, weakness, light-headedness and headaches.  Psychiatric/Behavioral:  Negative for behavioral problems, confusion, decreased concentration, dysphoric mood and sleep disturbance. The patient is not nervous/anxious.   All other systems reviewed and are negative.       Objective    BP 119/71 (BP Location: Right Arm, Cuff Size: Small)   Pulse 67   Ht 5\' 9"  (1.753 m)   Wt 206 lb 3.2 oz (93.5 kg)   SpO2 100%   BMI 30.45 kg/m     Physical Exam Vitals and nursing note reviewed.  Constitutional:  General: He is awake.     Appearance: Normal appearance.  HENT:     Head: Normocephalic and atraumatic.     Right Ear: Tympanic membrane, ear canal and external ear normal.     Left Ear: Tympanic membrane, ear canal and external ear normal.     Nose: Nose normal.     Mouth/Throat:     Mouth: Mucous membranes are moist.     Pharynx: Oropharynx is clear. No oropharyngeal exudate or posterior oropharyngeal erythema.  Eyes:     General: No scleral icterus.    Extraocular Movements: Extraocular movements intact.     Conjunctiva/sclera: Conjunctivae normal.     Pupils: Pupils are equal, round, and reactive to light.  Neck:     Thyroid: No thyromegaly or thyroid tenderness.  Cardiovascular:     Rate and Rhythm: Normal rate and regular rhythm.     Pulses: Normal pulses.     Heart sounds: Normal heart sounds.  Pulmonary:     Effort: Pulmonary effort is normal. No tachypnea, bradypnea or respiratory distress.     Breath sounds: Normal breath sounds. No stridor. No wheezing, rhonchi or rales.   Abdominal:     General: Bowel sounds are normal. There is no distension.     Palpations: Abdomen is soft. There is no mass.     Tenderness: There is no abdominal tenderness. There is no guarding.     Hernia: No hernia is present.  Musculoskeletal:     Cervical back: Normal range of motion and neck supple.     Right lower leg: No edema.     Left lower leg: No edema.  Lymphadenopathy:     Cervical: No cervical adenopathy.  Skin:    General: Skin is warm and dry.  Neurological:     Mental Status: He is alert and oriented to person, place, and time. Mental status is at baseline.  Psychiatric:        Mood and Affect: Mood normal.        Behavior: Behavior normal.     Depression Screen    05/22/2023    2:13 PM 05/22/2023    2:04 PM  PHQ 2/9 Scores  PHQ - 2 Score 1 0  PHQ- 9 Score 5    Results for orders placed or performed in visit on 05/22/23  Comprehensive metabolic panel  Result Value Ref Range   Glucose 87 70 - 99 mg/dL   BUN 14 6 - 20 mg/dL   Creatinine, Ser 1.61 0.76 - 1.27 mg/dL   eGFR 096 >04 VW/UJW/1.19   BUN/Creatinine Ratio 18 9 - 20   Sodium 142 134 - 144 mmol/L   Potassium 4.1 3.5 - 5.2 mmol/L   Chloride 104 96 - 106 mmol/L   CO2 22 20 - 29 mmol/L   Calcium 9.8 8.7 - 10.2 mg/dL   Total Protein 7.2 6.0 - 8.5 g/dL   Albumin 4.8 4.3 - 5.2 g/dL   Globulin, Total 2.4 1.5 - 4.5 g/dL   Bilirubin Total 0.5 0.0 - 1.2 mg/dL   Alkaline Phosphatase 64 44 - 121 IU/L   AST 35 0 - 40 IU/L   ALT 57 (H) 0 - 44 IU/L  Hemoglobin A1c  Result Value Ref Range   Hgb A1c MFr Bld 5.9 (H) 4.8 - 5.6 %   Est. average glucose Bld gHb Est-mCnc 123 mg/dL  Lipid panel  Result Value Ref Range   Cholesterol, Total 190 100 - 199 mg/dL   Triglycerides 147  0 - 149 mg/dL   HDL 47 >29 mg/dL   VLDL Cholesterol Cal 24 5 - 40 mg/dL   LDL Chol Calc (NIH) 528 (H) 0 - 99 mg/dL   Chol/HDL Ratio 4.0 0.0 - 5.0 ratio  HIV Antibody (routine testing w rflx)  Result Value Ref Range   HIV Screen  4th Generation wRfx Non Reactive Non Reactive  HCV Ab w Reflex to Quant PCR  Result Value Ref Range   HCV Ab Non Reactive Non Reactive  Interpretation:  Result Value Ref Range   HCV Interp 1: Comment     Assessment & Plan     Encounter for medical examination to establish care  Seasonal allergic rhinitis due to pollen Assessment & Plan: Taking OTC allergy medication; does not recall name. No acute concerns.  Continue to monitor.   Obesity (BMI 30.0-34.9) -     Hemoglobin A1c  Family history of diabetes mellitus -     Hemoglobin A1c  Screening for endocrine, metabolic and immunity disorder -     Comprehensive metabolic panel with GFR -     Hemoglobin A1c  Encounter for hepatitis C screening test for low risk patient -     HCV Ab w Reflex to Quant PCR -     Interpretation:  Encounter for screening for HIV -     HIV Antibody (routine testing w rflx)  Screening for lipid disorders -     Lipid panel    Encounter for medical examination to establish care Physical exam overall unremarkable except as noted above. Routine lab work ordered as noted. Agreed to hepatitis C and HIV screening. Declined flu vaccine and COVID booster. - Administer Tdap vaccine. - Order blood work including hepatitis C and HIV screening.  Seasonal Allergies Congestion managed with over-the-counter medication. - Continue over-the-counter allergy medication as needed.  Obesity (BMI 30.0-34.9) Counseled patient on lifestyle modifications. Based on patient's obesity and family history of diabetes mellitus, we will go ahead and screen patient for diabetes with an A1c today.   Return in about 1 year (around 05/21/2024) for CPE.     I discussed the assessment and treatment plan with the patient  The patient was provided an opportunity to ask questions and all were answered. The patient agreed with the plan and demonstrated an understanding of the instructions.   The patient was advised to call back  or seek an in-person evaluation if the symptoms worsen or if the condition fails to improve as anticipated.    Sherlyn Hay, DO  [facility] [phone] (phone) [phone] (fax)  [facility]

## 2023-05-24 LAB — COMPREHENSIVE METABOLIC PANEL WITH GFR
ALT: 57 IU/L — ABNORMAL HIGH (ref 0–44)
AST: 35 IU/L (ref 0–40)
Albumin: 4.8 g/dL (ref 4.3–5.2)
Alkaline Phosphatase: 64 IU/L (ref 44–121)
BUN/Creatinine Ratio: 18 (ref 9–20)
BUN: 14 mg/dL (ref 6–20)
Bilirubin Total: 0.5 mg/dL (ref 0.0–1.2)
CO2: 22 mmol/L (ref 20–29)
Calcium: 9.8 mg/dL (ref 8.7–10.2)
Chloride: 104 mmol/L (ref 96–106)
Creatinine, Ser: 0.8 mg/dL (ref 0.76–1.27)
Globulin, Total: 2.4 g/dL (ref 1.5–4.5)
Glucose: 87 mg/dL (ref 70–99)
Potassium: 4.1 mmol/L (ref 3.5–5.2)
Sodium: 142 mmol/L (ref 134–144)
Total Protein: 7.2 g/dL (ref 6.0–8.5)
eGFR: 124 mL/min/{1.73_m2} (ref >59)

## 2023-05-24 LAB — LIPID PANEL
Chol/HDL Ratio: 4 ratio (ref 0.0–5.0)
Cholesterol, Total: 190 mg/dL (ref 100–199)
HDL: 47 mg/dL (ref >39)
LDL Chol Calc (NIH): 119 mg/dL — ABNORMAL HIGH (ref 0–99)
Triglycerides: 137 mg/dL (ref 0–149)
VLDL Cholesterol Cal: 24 mg/dL (ref 5–40)

## 2023-05-24 LAB — HCV AB W REFLEX TO QUANT PCR: HCV Ab: NONREACTIVE

## 2023-05-24 LAB — HEMOGLOBIN A1C
Est. average glucose Bld gHb Est-mCnc: 123 mg/dL
Hgb A1c MFr Bld: 5.9 % — ABNORMAL HIGH (ref 4.8–5.6)

## 2023-05-24 LAB — HIV ANTIBODY (ROUTINE TESTING W REFLEX)

## 2023-05-24 LAB — HCV INTERPRETATION

## 2023-06-03 ENCOUNTER — Encounter: Payer: Self-pay | Admitting: Family Medicine

## 2024-05-22 ENCOUNTER — Encounter: Admitting: Family Medicine
